# Patient Record
Sex: Female | Born: 2000 | Race: White | Hispanic: No | Marital: Single | Smoking: Never smoker
Health system: Southern US, Community
[De-identification: ages and names within clinical notes are randomized; demographics above are authoritative.]

## PROBLEM LIST (undated history)

## (undated) DIAGNOSIS — Z87442 Personal history of urinary calculi: Secondary | ICD-10-CM

---
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
## 2022-01-02 ENCOUNTER — Ambulatory Visit (INDEPENDENT_AMBULATORY_CARE_PROVIDER_SITE_OTHER): Payer: Medicaid Other | Admitting: Podiatry

## 2022-01-02 ENCOUNTER — Ambulatory Visit (INDEPENDENT_AMBULATORY_CARE_PROVIDER_SITE_OTHER): Payer: Medicaid Other

## 2022-01-02 DIAGNOSIS — M2012 Hallux valgus (acquired), left foot: Secondary | ICD-10-CM | POA: Diagnosis not present

## 2022-01-02 DIAGNOSIS — M2011 Hallux valgus (acquired), right foot: Secondary | ICD-10-CM

## 2022-01-02 DIAGNOSIS — Z01818 Encounter for other preprocedural examination: Secondary | ICD-10-CM

## 2022-01-02 NOTE — Progress Notes (Signed)
Subjective:  Patient ID: Nicole Underwood, female    DOB: 2000/11/26,  MRN: [id number]  No chief complaint on file.   21 y.o. female presents with the above complaint.  Patient presents with left bunion pain.  Patient is been going for quite some time is progressive gotten worse.  She wanted get it evaluated she has not seen anyone else prior to seeing me.  She states pain scale is 5 out of 10 hurts with ambulation and hurts with pressure.  She has tried shoe gear modification padding protecting none of which has helped.  She wanted to discuss surgical options at this time.  Review of Systems: Negative except as noted in the HPI. Denies N/V/F/Ch.  Past Medical History:  Diagnosis Date   History of kidney stones     Current Outpatient Medications:    ondansetron (ZOFRAN ODT) 4 MG disintegrating tablet, Take 1 tablet (4 mg total) by mouth every 8 (eight) hours as needed for nausea or vomiting., Disp: 30 tablet, Rfl: 0   oxyCODONE-acetaminophen (PERCOCET/ROXICET) 5-325 MG tablet, Take 1 tablet by mouth every 4 (four) hours as needed for severe pain., Disp: 30 tablet, Rfl: 0   tamsulosin (FLOMAX) 0.4 MG CAPS capsule, Take 1 capsule (0.4 mg total) by mouth daily., Disp: 14 capsule, Rfl: 0  Social History   Tobacco Use  Smoking Status Never  Smokeless Tobacco Never    No Known Allergies Objective:  There were no vitals filed for this visit. There is no height or weight on file to calculate BMI. Constitutional Well developed. Well nourished.  Vascular Dorsalis pedis pulses palpable bilaterally. Posterior tibial pulses palpable bilaterally. Capillary refill normal to all digits.  No cyanosis or clubbing noted. Pedal hair growth normal.  Neurologic Normal speech. Oriented to person, place, and time. Epicritic sensation to light touch grossly present bilaterally.  Dermatologic Nails well groomed and normal in appearance. No open wounds. No skin lesions.  Orthopedic: Normal joint  ROM without pain or crepitus bilaterally. Hallux abductovalgus deformity present.  No intra-articular pain. Left 1st MPJ full range of motion. Left 1st TMT without gross hypermobility. Right 1st MPJ full range of motion  Right 1st TMT without gross hypermobility. Lesser digital contractures absent bilaterally.   Radiographs: Taken and reviewed. Hallux abductovalgus deformity present. Metatarsal parabola normal. 1st/2nd IMA: Moderate; TSP: 4 out of 7  Assessment:   1. Hav (hallux abducto valgus), left   2. Encounter for preoperative examination for general surgical procedure    Plan:  Patient was evaluated and treated and all questions answered.  Hallux abductovalgus deformity, left -XR as above. -Patient has failed all conservative therapy and wishes to proceed with surgical intervention. All risks, benefits, and alternatives discussed with patient. No guarantees given. Consent reviewed and signed by patient. Post-op course explained at length. -Planned procedures: Chevron osteotomy with a possible phalangeal osteotomy -Risk factors: None -I discussed my preoperative intra postop plan in extensive detail given the amount of pain that she is experiencing she will benefit from surgical chevron osteotomy with a possible phalangeal osteotomy.  She agrees with plan like to proceed with surgery -Informed surgical risk consent was reviewed and read aloud to the patient.  I reviewed the films.  I have discussed my findings with the patient in great detail.  I have discussed all risks including but not limited to infection, stiffness, scarring, limp, disability, deformity, damage to blood vessels and nerves, numbness, poor healing, need for braces, arthritis, chronic pain, amputation, death.  All benefits  and realistic expectations discussed in great detail.  I have made no promises as to the outcome.  I have provided realistic expectations.  I have offered the patient a 2nd opinion, which they have  declined and assured me they preferred to proceed despite the risks   No follow-ups on file.

## 2022-01-29 ENCOUNTER — Other Ambulatory Visit: Payer: Self-pay | Admitting: Podiatry

## 2022-01-29 ENCOUNTER — Encounter: Payer: Self-pay | Admitting: Podiatry

## 2022-01-29 DIAGNOSIS — M2012 Hallux valgus (acquired), left foot: Secondary | ICD-10-CM | POA: Diagnosis not present

## 2022-01-29 MED ORDER — OXYCODONE-ACETAMINOPHEN 5-325 MG PO TABS: 1.0000 | ORAL_TABLET | ORAL | 0 refills | Status: AC | PRN

## 2022-01-29 MED ORDER — IBUPROFEN 800 MG PO TABS: 800.0000 mg | ORAL_TABLET | Freq: Four times a day (QID) | ORAL | 1 refills | Status: AC | PRN

## 2022-02-06 ENCOUNTER — Ambulatory Visit (INDEPENDENT_AMBULATORY_CARE_PROVIDER_SITE_OTHER): Payer: Medicaid Other

## 2022-02-06 ENCOUNTER — Ambulatory Visit (INDEPENDENT_AMBULATORY_CARE_PROVIDER_SITE_OTHER): Payer: Medicaid Other | Admitting: Podiatry

## 2022-02-06 ENCOUNTER — Encounter: Payer: Self-pay | Admitting: *Deleted

## 2022-02-06 DIAGNOSIS — M2012 Hallux valgus (acquired), left foot: Secondary | ICD-10-CM

## 2022-02-06 DIAGNOSIS — Z9889 Other specified postprocedural states: Secondary | ICD-10-CM

## 2022-02-06 NOTE — Progress Notes (Signed)
  Subjective:  Patient ID: Nicole Underwood, female    DOB: 03-22-2001,  MRN: [id number]  Chief Complaint  Patient presents with   Routine Post Op    POV #1 DOS 01/29/2022 LT BUNIONECTOMY W/POSS PHANGEAL OSTEOTOMY    DOS: 01/29/2022 Procedure: Left bunionectomy  21 y.o. female returns for post-op check.  Patient states she is doing well.  Minimal complaints.  Bandages clean dry and intact.  Doing well on pain medication  Review of Systems: Negative except as noted in the HPI. Denies N/V/F/Ch.  Past Medical History:  Diagnosis Date   History of kidney stones     Current Outpatient Medications:    ibuprofen (ADVIL) 800 MG tablet, Take 1 tablet (800 mg total) by mouth every 6 (six) hours as needed., Disp: 60 tablet, Rfl: 1   ondansetron (ZOFRAN ODT) 4 MG disintegrating tablet, Take 1 tablet (4 mg total) by mouth every 8 (eight) hours as needed for nausea or vomiting., Disp: 30 tablet, Rfl: 0   oxyCODONE-acetaminophen (PERCOCET) 5-325 MG tablet, Take 1 tablet by mouth every 4 (four) hours as needed for severe pain., Disp: 30 tablet, Rfl: 0   oxyCODONE-acetaminophen (PERCOCET/ROXICET) 5-325 MG tablet, Take 1 tablet by mouth every 4 (four) hours as needed for severe pain., Disp: 30 tablet, Rfl: 0   tamsulosin (FLOMAX) 0.4 MG CAPS capsule, Take 1 capsule (0.4 mg total) by mouth daily., Disp: 14 capsule, Rfl: 0  Social History   Tobacco Use  Smoking Status Never  Smokeless Tobacco Never    No Known Allergies Objective:  There were no vitals filed for this visit. There is no height or weight on file to calculate BMI. Constitutional Well developed. Well nourished.  Vascular Foot warm and well perfused. Capillary refill normal to all digits.   Neurologic Normal speech. Oriented to person, place, and time. Epicritic sensation to light touch grossly present bilaterally.  Dermatologic Skin healing well without signs of infection. Skin edges well coapted without signs of infection.   Orthopedic: Tenderness to palpation noted about the surgical site.   Radiographs: 3 views of skeletally mature adult left foot: Hardware is intact no signs of backing or loosening noted.  Reduction of bunion deformity noted Assessment:   1. Hav (hallux abducto valgus), left   2. Status post foot surgery    Plan:  Patient was evaluated and treated and all questions answered.  S/p foot surgery left -Progressing as expected post-operatively. -XR: See above -WB Status: Weightbearing as tolerated in cam boot -Sutures: Intact.  No clinical signs of Deis is noted no complication noted. -Medications: None -Foot redressed.  No follow-ups on file.

## 2022-02-20 ENCOUNTER — Ambulatory Visit (INDEPENDENT_AMBULATORY_CARE_PROVIDER_SITE_OTHER): Payer: Medicaid Other | Admitting: Podiatry

## 2022-02-20 ENCOUNTER — Encounter: Payer: Self-pay | Admitting: *Deleted

## 2022-02-20 ENCOUNTER — Encounter: Payer: Self-pay | Admitting: Podiatry

## 2022-02-20 DIAGNOSIS — N920 Excessive and frequent menstruation with regular cycle: Secondary | ICD-10-CM | POA: Insufficient documentation

## 2022-02-20 DIAGNOSIS — M2012 Hallux valgus (acquired), left foot: Secondary | ICD-10-CM

## 2022-02-20 DIAGNOSIS — D509 Iron deficiency anemia, unspecified: Secondary | ICD-10-CM | POA: Insufficient documentation

## 2022-02-20 DIAGNOSIS — Z9889 Other specified postprocedural states: Secondary | ICD-10-CM

## 2022-02-20 NOTE — Progress Notes (Signed)
  Subjective:  Patient ID: Nicole Underwood, female    DOB: 2000-12-13,  MRN: [id number]  Chief Complaint  Patient presents with   Routine Post Op    POV #2 DOS 01/29/2022 LT BUNIONECTOMY W/POSS PHANGEAL OSTEOTOMY    DOS: 01/29/2022 Procedure: Left bunionectomy  21 y.o. female returns for post-op check.  Patient states she is doing well.  Minimal complaints.  Bandages clean dry and intact.  Doing well on pain medication  Review of Systems: Negative except as noted in the HPI. Denies N/V/F/Ch.  Past Medical History:  Diagnosis Date   History of kidney stones     Current Outpatient Medications:    amoxicillin (AMOXIL) 875 MG tablet, Take 875 mg by mouth 2 (two) times daily., Disp: , Rfl:    cetirizine (ZYRTEC) 10 MG tablet, Take 10 mg by mouth daily., Disp: , Rfl:    fluticasone (FLONASE) 50 MCG/ACT nasal spray, Place into both nostrils., Disp: , Rfl:    ibuprofen (ADVIL) 800 MG tablet, Take 1 tablet (800 mg total) by mouth every 6 (six) hours as needed., Disp: 60 tablet, Rfl: 1   ondansetron (ZOFRAN ODT) 4 MG disintegrating tablet, Take 1 tablet (4 mg total) by mouth every 8 (eight) hours as needed for nausea or vomiting., Disp: 30 tablet, Rfl: 0   oxyCODONE-acetaminophen (PERCOCET) 5-325 MG tablet, Take 1 tablet by mouth every 4 (four) hours as needed for severe pain., Disp: 30 tablet, Rfl: 0   oxyCODONE-acetaminophen (PERCOCET/ROXICET) 5-325 MG tablet, Take 1 tablet by mouth every 4 (four) hours as needed for severe pain., Disp: 30 tablet, Rfl: 0   sulfamethoxazole-trimethoprim (BACTRIM DS) 800-160 MG tablet, Take 1 tablet by mouth 2 (two) times daily., Disp: , Rfl:    tamsulosin (FLOMAX) 0.4 MG CAPS capsule, Take 1 capsule (0.4 mg total) by mouth daily., Disp: 14 capsule, Rfl: 0  Social History   Tobacco Use  Smoking Status Never  Smokeless Tobacco Never    No Known Allergies Objective:  There were no vitals filed for this visit. There is no height or weight on file to  calculate BMI. Constitutional Well developed. Well nourished.  Vascular Foot warm and well perfused. Capillary refill normal to all digits.   Neurologic Normal speech. Oriented to person, place, and time. Epicritic sensation to light touch grossly present bilaterally.  Dermatologic Skin completely reepithelialized.  No signs of dehiscence noted.  Good reduction of bunion deformity noted  Orthopedic: No further tenderness to palpation noted about the surgical site.   Radiographs: 3 views of skeletally mature adult left foot: Hardware is intact no signs of backing or loosening noted.  Reduction of bunion deformity noted Assessment:   1. Hav (hallux abducto valgus), left   2. Status post foot surgery     Plan:  Patient was evaluated and treated and all questions answered.  S/p foot surgery left -Progressing as expected post-operatively. -XR: See above -WB Status: Weightbearing as tolerated in regular shoes -Sutures: Removed.  No dehiscence noted.  No complication noted. -Medications: None -At the point patient is officially discharged from my care.  If any foot and ankle issues arise in the future of asked her to come back and see me.  No follow-ups on file.

## 2022-03-19 ENCOUNTER — Telehealth: Payer: Self-pay | Admitting: Podiatry

## 2022-03-19 NOTE — Telephone Encounter (Signed)
Caregiver called on after hours that pt had surgery with you and her foot is swollen and red. Patients surgery was weeks ago.

## 2022-03-19 NOTE — Telephone Encounter (Signed)
I called and spoke with caregiver/mom and offered her appt today with Dr Milinda Pointer. Mom states she cant come scheduled her appt tomorrow at 8:45 with Dr Posey Pronto

## 2022-03-20 ENCOUNTER — Encounter: Payer: Medicaid Other | Admitting: Podiatry

## 2023-10-23 ENCOUNTER — Other Ambulatory Visit: Payer: Self-pay

## 2023-10-23 ENCOUNTER — Encounter: Payer: Self-pay | Admitting: Emergency Medicine

## 2023-10-23 ENCOUNTER — Emergency Department
Admission: EM | Admit: 2023-10-23 | Discharge: 2023-10-23 | Disposition: A | Discharge status: Home / Self Care | Attending: Emergency Medicine | Admitting: Emergency Medicine

## 2023-10-23 DIAGNOSIS — R112 Nausea with vomiting, unspecified: Secondary | ICD-10-CM

## 2023-10-23 DIAGNOSIS — Y907 Blood alcohol level of 200-239 mg/100 ml: Secondary | ICD-10-CM | POA: Insufficient documentation

## 2023-10-23 DIAGNOSIS — F10129 Alcohol abuse with intoxication, unspecified: Secondary | ICD-10-CM | POA: Insufficient documentation

## 2023-10-23 DIAGNOSIS — F10920 Alcohol use, unspecified with intoxication, uncomplicated: Secondary | ICD-10-CM

## 2023-10-23 LAB — CBC WITH DIFFERENTIAL/PLATELET
Abs Immature Granulocytes: 0.02 10*3/uL (ref 0.00–0.07)
Basophils Absolute: 0.1 10*3/uL (ref 0.0–0.1)
Basophils Relative: 1 %
Eosinophils Absolute: 0.2 10*3/uL (ref 0.0–0.5)
Eosinophils Relative: 3 %
HCT: 41 % (ref 36.0–46.0)
Hemoglobin: 14 g/dL (ref 12.0–15.0)
Immature Granulocytes: 0 %
Lymphocytes Relative: 29 %
Lymphs Abs: 1.8 10*3/uL (ref 0.7–4.0)
MCH: 34.9 pg — ABNORMAL HIGH (ref 26.0–34.0)
MCHC: 34.1 g/dL (ref 30.0–36.0)
MCV: 102.2 fL — ABNORMAL HIGH (ref 80.0–100.0)
Monocytes Absolute: 0.4 10*3/uL (ref 0.1–1.0)
Monocytes Relative: 6 %
Neutro Abs: 3.8 10*3/uL (ref 1.7–7.7)
Neutrophils Relative %: 61 %
Platelets: 243 10*3/uL (ref 150–400)
RBC: 4.01 MIL/uL (ref 3.87–5.11)
RDW: 12.3 % (ref 11.5–15.5)
WBC: 6.2 10*3/uL (ref 4.0–10.5)
nRBC: 0 % (ref 0.0–0.2)

## 2023-10-23 LAB — COMPREHENSIVE METABOLIC PANEL WITH GFR
ALT: 13 U/L (ref 0–44)
AST: 19 U/L (ref 15–41)
Albumin: 4.2 g/dL (ref 3.5–5.0)
Alkaline Phosphatase: 76 U/L (ref 38–126)
Anion gap: 9 (ref 5–15)
BUN: 7 mg/dL (ref 6–20)
CO2: 22 mmol/L (ref 22–32)
Calcium: 8.7 mg/dL — ABNORMAL LOW (ref 8.9–10.3)
Chloride: 110 mmol/L (ref 98–111)
Creatinine, Ser: 0.77 mg/dL (ref 0.44–1.00)
GFR, Estimated: >60 mL/min (ref >60)
Glucose, Bld: 110 mg/dL — ABNORMAL HIGH (ref 70–99)
Potassium: 3.4 mmol/L — ABNORMAL LOW (ref 3.5–5.1)
Sodium: 141 mmol/L (ref 135–145)
Total Bilirubin: 1.8 mg/dL — ABNORMAL HIGH (ref 0.0–1.2)
Total Protein: 7 g/dL (ref 6.5–8.1)

## 2023-10-23 LAB — URINALYSIS, ROUTINE W REFLEX MICROSCOPIC
Bilirubin Urine: NEGATIVE
Glucose, UA: NEGATIVE mg/dL
Hgb urine dipstick: NEGATIVE
Ketones, ur: NEGATIVE mg/dL
Leukocytes,Ua: NEGATIVE
Nitrite: NEGATIVE
Protein, ur: NEGATIVE mg/dL
Specific Gravity, Urine: 1.008 (ref 1.005–1.030)
pH: 8 (ref 5.0–8.0)

## 2023-10-23 LAB — POC URINE PREG, ED: Preg Test, Ur: NEGATIVE

## 2023-10-23 LAB — ETHANOL: Alcohol, Ethyl (B): 215 mg/dL — ABNORMAL HIGH (ref <15)

## 2023-10-23 MED ORDER — ONDANSETRON 4 MG PO TBDP
4.0000 mg | ORAL_TABLET | Freq: Once | ORAL | Status: AC
  Administered 2023-10-23: 4 mg via ORAL
  Filled 2023-10-23: qty 1

## 2023-10-23 MED ORDER — ONDANSETRON 4 MG PO TBDP: 4.0000 mg | ORAL_TABLET | Freq: Three times a day (TID) | ORAL | 0 refills | Status: AC | PRN

## 2023-10-23 MED ORDER — ONDANSETRON HCL 4 MG PO TABS: 4.0000 mg | ORAL_TABLET | Freq: Three times a day (TID) | ORAL | 0 refills | Status: DC | PRN

## 2023-10-23 NOTE — ED Provider Notes (Signed)
 [facility] Provider Note    Event Date/Time   First MD Initiated Contact with Patient 10/23/23 0403     (approximate)   History   Alcohol Intoxication   HPI  Nicole Underwood is a 23 y.o. female   Young woman presents to the emergency department today after feeling nauseated and vomiting after drinking alcohol at the bar.  She was in her regular state of health and went out to drink, and when she got home she felt sick to her stomach and vomited multiple times.  She told her roommate about her symptoms and asked to be brought to the emergency department.  She denies abdominal pain.  She denies any coingestions, drug use.  She denies any urinary symptoms.  By the time I meet with her after her labs are completed and she has been in the emergency department for approximately 1 to 2 hours, she feels better, no longer nauseated and wants to go home to rest.   Independent Historian contributed to assessment above: Her friend is at bedside to corroborate information past medical history above  External Medical Documents Reviewed: Hospitalization notes and urology notes from March 2022 for ureterolithiasis      Physical Exam   Triage Vital Signs: ED Triage Vitals  Encounter Vitals Group     BP 10/23/23 0258 118/76     Systolic BP Percentile --      Diastolic BP Percentile --      Pulse Rate 10/23/23 0258 92     Resp 10/23/23 0258 18     Temp 10/23/23 0258 98 F (36.7 C)     Temp Source 10/23/23 0258 Oral     SpO2 10/23/23 0258 100 %     Weight 10/23/23 0300 97 lb (44 kg)     Height 10/23/23 0300 5\' 2"  (1.575 m)     Head Circumference --      Peak Flow --      Pain Score 10/23/23 0300 0     Pain Loc --      Pain Education --      Exclude from Growth Chart --     Most recent vital signs: Vitals:   10/23/23 0258  BP: 118/76  Pulse: 92  Resp: 18  Temp: 98 F (36.7 C)  SpO2: 100%    General: Awake, no distress.  CV:  Good peripheral  perfusion.  Resp:  Normal effort.  Abd:  No distention.  Other:  Awake alert comfortable appearing pleasant no acute distress noted.  Soft benign abdominal exam deep palpation all quadrants and vital signs reviewed and normal.  Appears clinically sober and appropriate.   ED Results / Procedures / Treatments   Labs (all labs ordered are listed, but only abnormal results are displayed) Labs Reviewed  CBC WITH DIFFERENTIAL/PLATELET - Abnormal; Notable for the following components:      Result Value   MCV 102.2 (*)    MCH 34.9 (*)    All other components within normal limits  COMPREHENSIVE METABOLIC PANEL WITH GFR - Abnormal; Notable for the following components:   Potassium 3.4 (*)    Glucose, Bld 110 (*)    Calcium 8.7 (*)    Total Bilirubin 1.8 (*)    All other components within normal limits  ETHANOL - Abnormal; Notable for the following components:   Alcohol, Ethyl (B) 215 (*)    All other components within normal limits  URINALYSIS, ROUTINE W REFLEX MICROSCOPIC - Abnormal; Notable  for the following components:   Color, Urine YELLOW (*)    APPearance CLEAR (*)    All other components within normal limits  POC URINE PREG, ED     I ordered and reviewed the above labs they are notable for alcohol level is over 200.  Otherwise cell counts and electrolytes, urinalysis unremarkable.   PROCEDURES:  Critical Care performed: No  Procedures   MEDICATIONS ORDERED IN ED: Medications  ondansetron  (ZOFRAN -ODT) disintegrating tablet 4 mg (has no administration in time range)   IMPRESSION / MDM / ASSESSMENT AND PLAN / ED COURSE  I reviewed the triage vital signs and the nursing notes.                                Patient's presentation is most consistent with acute presentation with potential threat to life or bodily function.  Differential diagnosis includes, but is not limited to, alcohol intoxication, considered but less likely intra-abdominal infection or obstruction     MDM:     She comes the emergency department after drinking significant mount of alcohol and feeling nauseated and vomiting at home. No co-ingestions.   She feels much better now without intervention, but the passing of time.  She has a benign abdominal exam and so I doubt surgical abdominal pathologies at this time.  Check labs including electrolytes, glucose, pregnancy all of which are unremarkable.  Patient feels well now and wants to go home to rest and I think this is appropriate.  I will give her some Zofran  and a prescription for the same.  Respiratory guidance given, return precautions given, follow-up with PMD and discharge.       FINAL CLINICAL IMPRESSION(S) / ED DIAGNOSES   Final diagnoses:  Alcoholic intoxication without complication (HCC)  Nausea and vomiting, unspecified vomiting type     Rx / DC Orders   ED Discharge Orders          Ordered    ondansetron  (ZOFRAN -ODT) 4 MG disintegrating tablet  Every 8 hours PRN        10/23/23 0411             Note:  This document was prepared using Dragon voice recognition software and may include unintentional dictation errors.    Buell Carmin, MD 10/23/23 [phone]

## 2023-10-23 NOTE — ED Triage Notes (Signed)
 Pt presents to the ED via POV with complaints of ETOH intoxication. Pt states she had a mix of liquor and beer tonight and had some episodes of vomiting. Per friends, they brought her because she was "dry heaving" and was concerned about her being sick.  A&Ox4 at this time. Denies CP or SOB.

## 2023-10-23 NOTE — Discharge Instructions (Addendum)
 Thank you for choosing [place] for your health care today!  Please see your primary doctor this week for a follow up appointment.   If you have any new, worsening, or unexpected symptoms call your doctor right away or come back to the emergency department for reevaluation.  It was my pleasure to care for you today.   Daneil Dan Modesto Charon, MD

## 2024-05-05 ENCOUNTER — Other Ambulatory Visit: Payer: Self-pay

## 2024-05-05 ENCOUNTER — Encounter: Payer: Self-pay | Admitting: Emergency Medicine

## 2024-05-05 DIAGNOSIS — W01198A Fall on same level from slipping, tripping and stumbling with subsequent striking against other object, initial encounter: Secondary | ICD-10-CM | POA: Insufficient documentation

## 2024-05-05 DIAGNOSIS — S060X0A Concussion without loss of consciousness, initial encounter: Secondary | ICD-10-CM | POA: Diagnosis not present

## 2024-05-05 DIAGNOSIS — S0990XA Unspecified injury of head, initial encounter: Secondary | ICD-10-CM | POA: Diagnosis present

## 2024-05-05 NOTE — ED Triage Notes (Signed)
 Pt arrives POV ambulatory to triage, gait steady, no acute distress noted reporting head pain x 1.5 week. Pt states she fell and hit back of her head 1.5 weeks ago and has been hurting since, not getting better; having difficulty concentrating, fatigue, and nausea. Pt took ibuprofen  around 1630 today.

## 2024-05-06 ENCOUNTER — Emergency Department
Admission: EM | Admit: 2024-05-06 | Discharge: 2024-05-06 | Disposition: A | Discharge status: Home / Self Care | Attending: Emergency Medicine | Admitting: Emergency Medicine

## 2024-05-06 DIAGNOSIS — S060X0A Concussion without loss of consciousness, initial encounter: Secondary | ICD-10-CM

## 2024-05-06 MED ORDER — DIPHENHYDRAMINE HCL 50 MG/ML IJ SOLN
25.0000 mg | Freq: Once | INTRAMUSCULAR | Status: AC
  Administered 2024-05-06: 25 mg via INTRAVENOUS
  Filled 2024-05-06: qty 1

## 2024-05-06 MED ORDER — METOCLOPRAMIDE HCL 5 MG/ML IJ SOLN
10.0000 mg | Freq: Once | INTRAMUSCULAR | Status: AC
  Administered 2024-05-06: 10 mg via INTRAVENOUS
  Filled 2024-05-06: qty 2

## 2024-05-06 MED ORDER — SODIUM CHLORIDE 0.9 % IV BOLUS
1000.0000 mL | Freq: Once | INTRAVENOUS | Status: AC
  Administered 2024-05-06: 1000 mL via INTRAVENOUS

## 2024-05-06 MED ORDER — KETOROLAC TROMETHAMINE 30 MG/ML IJ SOLN
30.0000 mg | Freq: Once | INTRAMUSCULAR | Status: AC
  Administered 2024-05-06: 30 mg via INTRAVENOUS
  Filled 2024-05-06: qty 1

## 2024-05-06 NOTE — ED Notes (Signed)
 Padu

## 2024-05-06 NOTE — ED Provider Notes (Signed)
 [facility] Provider Note    Event Date/Time   First MD Initiated Contact with Patient 05/06/24 0029     (approximate)  History   Chief Complaint: Headache  HPI  Nicole Underwood is a 23 y.o. female with no past medical history who presents to the emergency department for continued headache.  According to the patient she states a week and a half ago she was messing around and fell backwards hitting the back of her head.  Patient states she had pain at that time, no LOC no vomiting.  However since that head injury she has continued to have headaches throughout most the day.  Patient states she feels like she cannot remember all the events around the head injury although was able to give a good history.  Here the patient is awake alert no distress reassuring vital signs, reassuring exam.  Physical Exam   Triage Vital Signs: ED Triage Vitals  Encounter Vitals Group     BP 05/05/24 2141 124/83     Girls Systolic BP Percentile --      Girls Diastolic BP Percentile --      Boys Systolic BP Percentile --      Boys Diastolic BP Percentile --      Pulse Rate 05/05/24 2141 91     Resp 05/05/24 2141 18     Temp 05/05/24 2141 98.4 F (36.9 C)     Temp Source 05/05/24 2141 Oral     SpO2 05/05/24 2141 99 %     Weight --      Height --      Head Circumference --      Peak Flow --      Pain Score 05/05/24 2142 9     Pain Loc --      Pain Education --      Exclude from Growth Chart --     Most recent vital signs: Vitals:   05/05/24 2141  BP: 124/83  Pulse: 91  Resp: 18  Temp: 98.4 F (36.9 C)  SpO2: 99%    General: Awake, no distress.  Mild photophobia on exam pupils 3 mm reactive bilaterally. CV:  Good peripheral perfusion.  Regular rate and rhythm  Resp:  Normal effort.  Equal breath sounds bilaterally.  Abd:  No distention.    ED Results / Procedures / Treatments   MEDICATIONS ORDERED IN ED: Medications  ketorolac  (TORADOL ) 30 MG/ML injection  30 mg (has no administration in time range)  metoCLOPramide  (REGLAN ) injection 10 mg (has no administration in time range)  diphenhydrAMINE  (BENADRYL ) injection 25 mg (has no administration in time range)  sodium chloride  0.9 % bolus 1,000 mL (has no administration in time range)     IMPRESSION / MDM / ASSESSMENT AND PLAN / ED COURSE  I reviewed the triage vital signs and the nursing notes.  Patient's presentation is most consistent with acute presentation with potential threat to life or bodily function.  Patient presents emergency department after head injury 1.5 weeks ago.  No LOC nausea or vomiting at the time of the head injury.  Patient has continued to have some headache symptoms throughout most the day she also is complaining of feeling somewhat foggy.  Symptoms seem suggestive of concussion/postconcussive syndrome.  Given no LOC at the time no vomiting, do not believe head CT is warranted as it has now been 10 days since the event.  I did discuss risks and benefit of head CT patient is also  agreeable to holding off on head CT at this time.  I do believe the patient would benefit from medications.  We will place an IV and dose Toradol  Reglan  Benadryl  IV fluids and reassess.  Discussed my typical concussion care and return precautions.  Patient agreeable to plan.  Patient states she is much better after medications and fluids.  Will discharge home with outpatient follow-up.  Provided my typical concussion return precautions.  FINAL CLINICAL IMPRESSION(S) / ED DIAGNOSES   Concussion   Note:  This document was prepared using Dragon voice recognition software and may include unintentional dictation errors.   Dorothyann Drivers, MD 05/06/24 [phone]
